# Patient Record
Sex: Male | Born: 1990 | Race: White | Hispanic: No | Marital: Single | Smoking: Current every day smoker
Health system: Southern US, Community
[De-identification: ages and names within clinical notes are randomized; demographics above are authoritative.]

## PROBLEM LIST (undated history)

## (undated) DIAGNOSIS — J189 Pneumonia, unspecified organism: Secondary | ICD-10-CM

## (undated) DIAGNOSIS — J45909 Unspecified asthma, uncomplicated: Secondary | ICD-10-CM

---

## 2013-09-12 ENCOUNTER — Encounter (HOSPITAL_COMMUNITY): Payer: Self-pay | Admitting: Emergency Medicine

## 2013-09-12 ENCOUNTER — Emergency Department (HOSPITAL_COMMUNITY)
Admission: EM | Admit: 2013-09-12 | Discharge: 2013-09-12 | Disposition: A | Discharge status: Home / Self Care | Payer: 59 | Attending: Emergency Medicine | Admitting: Emergency Medicine

## 2013-09-12 DIAGNOSIS — T23299A Burn of second degree of multiple sites of unspecified wrist and hand, initial encounter: Secondary | ICD-10-CM

## 2013-09-12 DIAGNOSIS — X038XXA Other exposure to controlled fire, not in building or structure, initial encounter: Secondary | ICD-10-CM | POA: Insufficient documentation

## 2013-09-12 DIAGNOSIS — F17200 Nicotine dependence, unspecified, uncomplicated: Secondary | ICD-10-CM | POA: Insufficient documentation

## 2013-09-12 DIAGNOSIS — Y929 Unspecified place or not applicable: Secondary | ICD-10-CM | POA: Insufficient documentation

## 2013-09-12 DIAGNOSIS — Y939 Activity, unspecified: Secondary | ICD-10-CM | POA: Insufficient documentation

## 2013-09-12 DIAGNOSIS — T23279A Burn of second degree of unspecified wrist, initial encounter: Secondary | ICD-10-CM | POA: Insufficient documentation

## 2013-09-12 DIAGNOSIS — T23209A Burn of second degree of unspecified hand, unspecified site, initial encounter: Secondary | ICD-10-CM | POA: Insufficient documentation

## 2013-09-12 MED ORDER — OXYCODONE-ACETAMINOPHEN 5-325 MG PO TABS: 1.0000 | ORAL_TABLET | ORAL | Status: DC | PRN

## 2013-09-12 MED ORDER — CEPHALEXIN 500 MG PO CAPS: 500.0000 mg | ORAL_CAPSULE | Freq: Four times a day (QID) | ORAL | Status: DC

## 2013-09-12 MED ORDER — OXYCODONE-ACETAMINOPHEN 5-325 MG PO TABS
2.0000 | ORAL_TABLET | Freq: Once | ORAL | Status: AC
  Administered 2013-09-12: 2 via ORAL
  Filled 2013-09-12: qty 2

## 2013-09-12 MED ORDER — SULFAMETHOXAZOLE-TRIMETHOPRIM 800-160 MG PO TABS: 1.0000 | ORAL_TABLET | Freq: Two times a day (BID) | ORAL | Status: DC

## 2013-09-12 MED ORDER — SILVER SULFADIAZINE 1 % EX CREA
TOPICAL_CREAM | Freq: Once | CUTANEOUS | Status: AC
Start: 2013-09-12 — End: 2013-09-12
  Administered 2013-09-12: 13:00:00 via TOPICAL
  Filled 2013-09-12: qty 85

## 2013-09-12 NOTE — ED Notes (Signed)
Applied Silvadene to underside of right arm with tongue depressor then placed 2 non adherent pads wrapped with gauze and taped; instructions on how to change given to pt as well as mother

## 2013-09-12 NOTE — Discharge Instructions (Signed)
Take Percocet as needed for pain. Apply silvadene cream twice daily. Take bactrim and keflex as directed until gone-these are your antibiotics. Refer to attached documents for more information.
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
## 2013-09-13 NOTE — ED Provider Notes (Signed)
Medical screening examination/treatment/procedure(s) were performed by non-physician practitioner and as supervising physician I was immediately available for consultation/collaboration.   EKG Interpretation None        Alfonzo Arca M Kentley Cedillo, DO 09/13/13 0739 

## 2017-01-06 DIAGNOSIS — J9600 Acute respiratory failure, unspecified whether with hypoxia or hypercapnia: Secondary | ICD-10-CM

## 2017-01-06 DIAGNOSIS — J209 Acute bronchitis, unspecified: Secondary | ICD-10-CM

## 2017-01-17 ENCOUNTER — Emergency Department (HOSPITAL_COMMUNITY): Payer: Self-pay

## 2017-01-17 ENCOUNTER — Encounter (HOSPITAL_COMMUNITY): Payer: Self-pay | Admitting: Emergency Medicine

## 2017-01-17 ENCOUNTER — Emergency Department (HOSPITAL_COMMUNITY)
Admission: EM | Admit: 2017-01-17 | Discharge: 2017-01-17 | Discharge status: Against Medical Advice | Payer: Self-pay | Attending: Emergency Medicine | Admitting: Emergency Medicine

## 2017-01-17 DIAGNOSIS — F17200 Nicotine dependence, unspecified, uncomplicated: Secondary | ICD-10-CM | POA: Insufficient documentation

## 2017-01-17 DIAGNOSIS — R0789 Other chest pain: Secondary | ICD-10-CM | POA: Insufficient documentation

## 2017-01-17 DIAGNOSIS — R0602 Shortness of breath: Secondary | ICD-10-CM | POA: Insufficient documentation

## 2017-01-17 DIAGNOSIS — R000 Tachycardia, unspecified: Secondary | ICD-10-CM | POA: Insufficient documentation

## 2017-01-17 DIAGNOSIS — R079 Chest pain, unspecified: Secondary | ICD-10-CM

## 2017-01-17 DIAGNOSIS — R05 Cough: Secondary | ICD-10-CM | POA: Insufficient documentation

## 2017-01-17 DIAGNOSIS — F1510 Other stimulant abuse, uncomplicated: Secondary | ICD-10-CM | POA: Insufficient documentation

## 2017-01-17 HISTORY — DX: Pneumonia, unspecified organism: J18.9

## 2017-01-17 LAB — I-STAT ARTERIAL BLOOD GAS, ED
Acid-base deficit: 1 mmol/L (ref 0.0–2.0)
Bicarbonate: 24.2 mmol/L (ref 20.0–28.0)
O2 SAT: 96 %
PCO2 ART: 38.8 mmHg (ref 32.0–48.0)
PH ART: 7.401 (ref 7.350–7.450)
TCO2: 25 mmol/L (ref 0–100)
pO2, Arterial: 80 mmHg — ABNORMAL LOW (ref 83.0–108.0)

## 2017-01-17 LAB — URINALYSIS, ROUTINE W REFLEX MICROSCOPIC
Bilirubin Urine: NEGATIVE
GLUCOSE, UA: NEGATIVE mg/dL
HGB URINE DIPSTICK: NEGATIVE
Ketones, ur: NEGATIVE mg/dL
Leukocytes, UA: NEGATIVE
Nitrite: NEGATIVE
Protein, ur: NEGATIVE mg/dL
Specific Gravity, Urine: 1.008 (ref 1.005–1.030)
pH: 7 (ref 5.0–8.0)

## 2017-01-17 LAB — CBC WITH DIFFERENTIAL/PLATELET
Basophils Absolute: 0 10*3/uL (ref 0.0–0.1)
Basophils Relative: 0 %
Eosinophils Absolute: 1.5 10*3/uL — ABNORMAL HIGH (ref 0.0–0.7)
Eosinophils Relative: 15 %
HCT: 51.9 % (ref 39.0–52.0)
HEMOGLOBIN: 18.1 g/dL — AB (ref 13.0–17.0)
LYMPHS ABS: 2.8 10*3/uL (ref 0.7–4.0)
LYMPHS PCT: 28 %
MCH: 33.2 pg (ref 26.0–34.0)
MCHC: 34.9 g/dL (ref 30.0–36.0)
MCV: 95.2 fL (ref 78.0–100.0)
Monocytes Absolute: 0.8 10*3/uL (ref 0.1–1.0)
Monocytes Relative: 8 %
NEUTROS PCT: 49 %
Neutro Abs: 4.8 10*3/uL (ref 1.7–7.7)
Platelets: 275 10*3/uL (ref 150–400)
RBC: 5.45 MIL/uL (ref 4.22–5.81)
RDW: 14.2 % (ref 11.5–15.5)
WBC: 9.9 10*3/uL (ref 4.0–10.5)

## 2017-01-17 LAB — RAPID URINE DRUG SCREEN, HOSP PERFORMED
Amphetamines: POSITIVE — AB
BENZODIAZEPINES: NOT DETECTED
Barbiturates: NOT DETECTED
COCAINE: NOT DETECTED
Opiates: POSITIVE — AB
Tetrahydrocannabinol: NOT DETECTED

## 2017-01-17 LAB — COMPREHENSIVE METABOLIC PANEL
ALBUMIN: 4.6 g/dL (ref 3.5–5.0)
ALT: 39 U/L (ref 17–63)
ANION GAP: 8 (ref 5–15)
AST: 36 U/L (ref 15–41)
Alkaline Phosphatase: 98 U/L (ref 38–126)
BUN: 9 mg/dL (ref 6–20)
CHLORIDE: 102 mmol/L (ref 101–111)
CO2: 29 mmol/L (ref 22–32)
CREATININE: 0.84 mg/dL (ref 0.61–1.24)
Calcium: 9.8 mg/dL (ref 8.9–10.3)
GFR calc Af Amer: >60 mL/min (ref >60)
GFR calc non Af Amer: >60 mL/min (ref >60)
Glucose, Bld: 116 mg/dL — ABNORMAL HIGH (ref 65–99)
Potassium: 5.2 mmol/L — ABNORMAL HIGH (ref 3.5–5.1)
SODIUM: 139 mmol/L (ref 135–145)
Total Bilirubin: 1 mg/dL (ref 0.3–1.2)
Total Protein: 8.3 g/dL — ABNORMAL HIGH (ref 6.5–8.1)

## 2017-01-17 LAB — I-STAT CG4 LACTIC ACID, ED
Lactic Acid, Venous: 1.57 mmol/L (ref 0.5–1.9)
Lactic Acid, Venous: 1.67 mmol/L (ref 0.5–1.9)

## 2017-01-17 LAB — PROTIME-INR
INR: 0.96
Prothrombin Time: 12.8 seconds (ref 11.4–15.2)

## 2017-01-17 LAB — I-STAT TROPONIN, ED
Troponin i, poc: 0 ng/mL (ref 0.00–0.08)
Troponin i, poc: 0.07 ng/mL (ref 0.00–0.08)

## 2017-01-17 LAB — SALICYLATE LEVEL

## 2017-01-17 LAB — D-DIMER, QUANTITATIVE: D-Dimer, Quant: 0.33 ug/mL-FEU (ref 0.00–0.50)

## 2017-01-17 MED ORDER — IPRATROPIUM-ALBUTEROL 0.5-2.5 (3) MG/3ML IN SOLN
3.0000 mL | Freq: Once | RESPIRATORY_TRACT | Status: AC
  Administered 2017-01-17: 3 mL via RESPIRATORY_TRACT
  Filled 2017-01-17: qty 3

## 2017-01-17 MED ORDER — METHYLPREDNISOLONE SODIUM SUCC 125 MG IJ SOLR
125.0000 mg | Freq: Once | INTRAMUSCULAR | Status: AC
  Administered 2017-01-17: 125 mg via INTRAVENOUS
  Filled 2017-01-17: qty 2

## 2017-01-17 NOTE — ED Triage Notes (Signed)
Pt here with increased work of breathing; pt discharged from [facility] 1 week ago after having PNA; pt sts increased SOB x 3 days

## 2017-01-17 NOTE — ED Provider Notes (Signed)
[facility]-EMERGENCY DEPT Provider Note   CSN: [id number] Arrival date & time: 01/17/17  1116     History   Chief Complaint Chief Complaint  Patient presents with  . Shortness of Breath    HPI Jesse Shannon is a 26 y.o. male.  The history is provided by the patient and a significant other.  Shortness of Breath  This is a recurrent problem. The average episode lasts 3 days. The problem occurs continuously.The current episode started more than 2 days ago. The problem has been gradually worsening. Associated symptoms include cough, sputum production, wheezing and chest pain. Pertinent negatives include no fever, no headaches, no rhinorrhea, no neck pain, no hemoptysis, no syncope, no vomiting, no abdominal pain, no rash, no leg pain and no leg swelling. Risk factors include smoking. He has tried nothing for the symptoms. The treatment provided no relief. He has had prior hospitalizations. He has had prior ED visits. He has had prior ICU admissions. Associated medical issues include pneumonia. Associated medical issues do not include asthma, COPD, CAD, heart failure or past MI.    Past Medical History:  Diagnosis Date  . Pneumonia     There are no active problems to display for this patient.   History reviewed. No pertinent surgical history.     Home Medications    Prior to Admission medications   Medication Sig Start Date End Date Taking? Authorizing Provider  cephALEXin (KEFLEX) 500 MG capsule Take 1 capsule (500 mg total) by mouth 4 (four) times daily. 09/12/13   Emilia Beck, PA-C  oxyCODONE-acetaminophen (PERCOCET/ROXICET) 5-325 MG per tablet Take 1-2 tablets by mouth every 4 (four) hours as needed for severe pain. 09/12/13   Emilia Beck, PA-C  sulfamethoxazole-trimethoprim (SEPTRA DS) 800-160 MG per tablet Take 1 tablet by mouth every 12 (twelve) hours. 09/12/13   Emilia Beck, PA-C    Family History History reviewed. No pertinent family history.  Social  History Social History  Substance Use Topics  . Smoking status: Current Every Day Smoker  . Smokeless tobacco: Never Used  . Alcohol use No     Allergies   Patient has no known allergies.   Review of Systems Review of Systems  Constitutional: Negative for appetite change, chills, diaphoresis, fatigue and fever.  HENT: Negative for congestion and rhinorrhea.   Respiratory: Positive for cough, sputum production, chest tightness, shortness of breath and wheezing. Negative for hemoptysis and stridor.   Cardiovascular: Positive for chest pain. Negative for palpitations, leg swelling and syncope.  Gastrointestinal: Negative for abdominal pain, constipation, diarrhea, nausea and vomiting.  Genitourinary: Negative for dysuria and frequency.  Musculoskeletal: Negative for back pain, neck pain and neck stiffness.  Skin: Negative for rash and wound.  Neurological: Negative for dizziness and headaches.  Psychiatric/Behavioral: Negative for agitation and confusion.  All other systems reviewed and are negative.    Physical Exam Updated Vital Signs BP (!) 136/97 (BP Location: Right Arm)   Pulse 100   Temp (!) 97.4 F (36.3 C) (Oral)   Resp 14   SpO2 100%   Physical Exam  Constitutional: He is oriented to person, place, and time. He appears well-developed and well-nourished. He appears distressed.  HENT:  Head: Normocephalic.  Nose: Nose normal.  Mouth/Throat: Oropharynx is clear and moist. No oropharyngeal exudate.  Eyes: Pupils are equal, round, and reactive to light. Conjunctivae and EOM are normal.  Neck: Normal range of motion.  Cardiovascular: Intact distal pulses.  Tachycardia present.   No murmur heard. Pulmonary/Chest: No stridor.  He is in respiratory distress. He has wheezes. He exhibits tenderness.  Abdominal: Soft. There is no tenderness. There is no guarding.  Musculoskeletal: He exhibits no tenderness.  Neurological: He is alert and oriented to person, place, and  time. No cranial nerve deficit or sensory deficit. He exhibits normal muscle tone.  Skin: Skin is warm. Capillary refill takes less than 2 seconds. He is not diaphoretic. No erythema. No pallor.  Psychiatric: He has a normal mood and affect.  Nursing note and vitals reviewed.    ED Treatments / Results  Labs (all labs ordered are listed, but only abnormal results are displayed) Labs Reviewed  COMPREHENSIVE METABOLIC PANEL - Abnormal; Notable for the following:       Result Value   Potassium 5.2 (*)    Glucose, Bld 116 (*)    Total Protein 8.3 (*)    All other components within normal limits  CBC WITH DIFFERENTIAL/PLATELET - Abnormal; Notable for the following:    Hemoglobin 18.1 (*)    Eosinophils Absolute 1.5 (*)    All other components within normal limits  RAPID URINE DRUG SCREEN, HOSP PERFORMED - Abnormal; Notable for the following:    Opiates POSITIVE (*)    Amphetamines POSITIVE (*)    All other components within normal limits  URINALYSIS, ROUTINE W REFLEX MICROSCOPIC - Abnormal; Notable for the following:    Color, Urine STRAW (*)    All other components within normal limits  I-STAT ARTERIAL BLOOD GAS, ED - Abnormal; Notable for the following:    pO2, Arterial 80.0 (*)    All other components within normal limits  CULTURE, BLOOD (ROUTINE X 2)  CULTURE, BLOOD (ROUTINE X 2)  PROTIME-INR  D-DIMER, QUANTITATIVE (NOT AT [facility])  SALICYLATE LEVEL  BLOOD GAS, ARTERIAL  I-STAT CG4 LACTIC ACID, ED  I-STAT TROPONIN, ED  I-STAT CG4 LACTIC ACID, ED  I-STAT TROPONIN, ED    EKG  EKG Interpretation  Date/Time:  Tuesday January 17 2017 13:21:24 EDT Ventricular Rate:  91 PR Interval:    QRS Duration: 86 QT Interval:  344 QTC Calculation: 424 R Axis:   89 Text Interpretation:  Sinus rhythm Biatrial enlargement Nonspecific T abnrm, anterolateral leads No prior ECG for comparison.  No STEMI Confirmed by Theda Belfast ([id number]) on 01/17/2017 4:09:42 PM       Radiology Dg Chest  2 View  Result Date: 01/17/2017 CLINICAL DATA:  Onset of shortness of breath last night. Recently diagnosed with pneumonia. Daily smoker. EXAM: CHEST  2 VIEW COMPARISON:  None in PACs FINDINGS: The lungs are hyperinflated with mild hemidiaphragm flattening. The perihilar lung markings are coarse. There is no alveolar infiltrate or pleural effusion. The heart and mediastinal structures are normal. The bony thorax exhibits no acute abnormality. IMPRESSION: Hyperinflation consistent with reactive airway disease and the patient's smoking history. Perihilar peribronchial cuffing bilaterally likely reflects acute bronchitis. No alveolar pneumonia is observed but early interstitial pneumonia cannot be excluded. Electronically Signed   By: David  [place] M.D.   On: 01/17/2017 12:38    Procedures Procedures (including critical care time)  Medications Ordered in ED Medications  ipratropium-albuterol (DUONEB) 0.5-2.5 (3) MG/3ML nebulizer solution 3 mL (3 mLs Nebulization Given 01/17/17 1333)  methylPREDNISolone sodium succinate (SOLU-MEDROL) 125 mg/2 mL injection 125 mg (125 mg Intravenous Given 01/17/17 1338)  ipratropium-albuterol (DUONEB) 0.5-2.5 (3) MG/3ML nebulizer solution 3 mL (3 mLs Nebulization Given 01/17/17 1615)     Initial Impression / Assessment and Plan / ED Course  I have reviewed  the triage vital signs and the nursing notes.  Pertinent labs & imaging results that were available during my care of the patient were reviewed by me and considered in my medical decision making (see chart for details).     Jesse Shannon is a 26 y.o. male with a past medical history significant for recent pneumonia status post intubation and ventilator support who presents with continued productive cough, shortness of breath, and third chest pain. Patient says that last week, he was admitted at [facility] to the ICU after intubation for "resting my lungs". He says that he thought he was told he had a pneumonia  but was generally not given information on this, discharge information, or antibiotics at discharge. Patient says that since discharge for the last few days he has had worsening shortness of breath and work of breathing. He reports that he is feeling is breathing very fast and has had continued cough. He reports he is having chest pain going around both sides of his chest. He does not have significant central chest pain. He describes the pain as moderate to severe. He reports it is worse with coughing and pleuritic. He denies any exertional chest pain but reports continued shortness of breath. He denies nausea, vomiting, constipation, diarrhea, or dysuria. He has no history in the chart of asthma or other prior medical problems. He denies recent dramatic injuries.  On exam, patient is to Make it has increased work of breathing. Patient has retractions and has sternocleidomastoid contractions with breathing. Patient is maintaining his oxygen saturation on room air. Patient is afebrile but has slight tachycardia. Patient's abdomen was nontender. Chest is nontender. Patient had significant wheezing in all lung fields. Patient had some tenderness on his lateral chest and back. Significant lower extremity edema. No focal neurologic deficits. Normal pulses in upper extremity.  Given patient's reported recent pneumonia and continued cough with shortness breath, patient will have workup to look for recurrent pneumonia. With his pleuritic pain, tachycardia, and shortness of breath, he will also have d-dimer sent to look for pulmonary embolism. She will be given steroids and breathing treatments to try and help his work of breathing and his wheezing.   Anticipate reassessment following medications and diagnostic testing.  Patients troponin was negative times two but was rising. Patient will have third troponin.  Patient had improvement in wheezing after breathing treatment. Patient continued to have some wheezing  and will have a repeat breathing treatment. Patient reports that his breathing is feeling better after steroids and initial treatment.  Patient laboratory testing began to return. Patient was found to have opioids and amphetamines in his urine. Patient says that the amphetamines are due to crystal meth that he smoked several days ago. Patient says that he thought that might be contributing to his symptoms. Suspect patient may have had asthma exacerbated by the meth causing his symptoms.  Patient will be reassessed after the reading treatment. Anticipate discharge with albuterol and PCP follow-up. Heart rate improved after drinking some fluids. Suspect increased heart rate may be due to albuterol treatments as D dimer was negative. Doubt PE. X-ray shows no pneumonia.  Care transferred to Dr. Juleen China while awaiting third troponin and reassessment. Anticipate discharge if vitals improved and symptoms improved. Care transferred in stable condition.   Final Clinical Impressions(s) / ED Diagnoses   Final diagnoses:  SOB (shortness of breath)  Methamphetamine abuse  Chest pain, unspecified type  Tachycardia    Clinical Impression: 1. SOB (shortness of breath)  2. Methamphetamine abuse   3. Chest pain, unspecified type   4. Tachycardia     Disposition: CAre transferred to Dr. Juleen China while awaiting troponin and reassessment of vitals.     Tegeler, Canary Brim, MD 01/17/17 1919

## 2017-01-17 NOTE — ED Notes (Signed)
Pt wheeled back to room from waiting area. Pt assisted into gown and onto monitor.

## 2017-01-17 NOTE — ED Notes (Addendum)
Patient anxious. Aware that he would be discharged shortly. Patient removed all monitoring equipment, as well as his IV. Patient left without discharge instructions. Notified EDP of elopement prior to discharge.

## 2017-01-17 NOTE — ED Notes (Signed)
Patient transported to X-ray 

## 2017-01-22 LAB — CULTURE, BLOOD (ROUTINE X 2)
Culture: NO GROWTH
Culture: NO GROWTH
SPECIAL REQUESTS: ADEQUATE
Special Requests: ADEQUATE

## 2017-03-14 ENCOUNTER — Inpatient Hospital Stay (HOSPITAL_COMMUNITY)
Admission: EM | Admit: 2017-03-14 | Discharge: 2017-03-14 | DRG: 202 | Discharge status: Against Medical Advice | Payer: Self-pay | Attending: Family Medicine | Admitting: Family Medicine

## 2017-03-14 ENCOUNTER — Encounter (HOSPITAL_COMMUNITY): Payer: Self-pay | Admitting: *Deleted

## 2017-03-14 ENCOUNTER — Emergency Department (HOSPITAL_COMMUNITY): Payer: Self-pay

## 2017-03-14 DIAGNOSIS — J45909 Unspecified asthma, uncomplicated: Secondary | ICD-10-CM | POA: Diagnosis present

## 2017-03-14 DIAGNOSIS — J9602 Acute respiratory failure with hypercapnia: Secondary | ICD-10-CM | POA: Diagnosis present

## 2017-03-14 DIAGNOSIS — J4522 Mild intermittent asthma with status asthmaticus: Principal | ICD-10-CM | POA: Diagnosis present

## 2017-03-14 DIAGNOSIS — J45901 Unspecified asthma with (acute) exacerbation: Secondary | ICD-10-CM

## 2017-03-14 DIAGNOSIS — Z87898 Personal history of other specified conditions: Secondary | ICD-10-CM

## 2017-03-14 DIAGNOSIS — J4521 Mild intermittent asthma with (acute) exacerbation: Secondary | ICD-10-CM

## 2017-03-14 DIAGNOSIS — F1290 Cannabis use, unspecified, uncomplicated: Secondary | ICD-10-CM | POA: Diagnosis present

## 2017-03-14 DIAGNOSIS — F1591 Other stimulant use, unspecified, in remission: Secondary | ICD-10-CM

## 2017-03-14 DIAGNOSIS — J9692 Respiratory failure, unspecified with hypercapnia: Secondary | ICD-10-CM

## 2017-03-14 DIAGNOSIS — R000 Tachycardia, unspecified: Secondary | ICD-10-CM

## 2017-03-14 DIAGNOSIS — Z87891 Personal history of nicotine dependence: Secondary | ICD-10-CM

## 2017-03-14 DIAGNOSIS — D72829 Elevated white blood cell count, unspecified: Secondary | ICD-10-CM

## 2017-03-14 DIAGNOSIS — J4552 Severe persistent asthma with status asthmaticus: Secondary | ICD-10-CM

## 2017-03-14 HISTORY — DX: Unspecified asthma, uncomplicated: J45.909

## 2017-03-14 LAB — BLOOD GAS, ARTERIAL
Acid-Base Excess: 2.9 mmol/L — ABNORMAL HIGH (ref 0.0–2.0)
Bicarbonate: 29.5 mmol/L — ABNORMAL HIGH (ref 20.0–28.0)
Drawn by: 232811
O2 SAT: 91.8 %
PATIENT TEMPERATURE: 98.1
PCO2 ART: 55.2 mmHg — AB (ref 32.0–48.0)
pH, Arterial: 7.346 — ABNORMAL LOW (ref 7.350–7.450)
pO2, Arterial: 65.8 mmHg — ABNORMAL LOW (ref 83.0–108.0)

## 2017-03-14 LAB — BASIC METABOLIC PANEL
Anion gap: 11 (ref 5–15)
BUN: 12 mg/dL (ref 6–20)
CHLORIDE: 97 mmol/L — AB (ref 101–111)
CO2: 29 mmol/L (ref 22–32)
Calcium: 8.8 mg/dL — ABNORMAL LOW (ref 8.9–10.3)
Creatinine, Ser: 0.74 mg/dL (ref 0.61–1.24)
GFR calc Af Amer: >60 mL/min (ref >60)
GFR calc non Af Amer: >60 mL/min (ref >60)
Glucose, Bld: 139 mg/dL — ABNORMAL HIGH (ref 65–99)
Potassium: 3.8 mmol/L (ref 3.5–5.1)
SODIUM: 137 mmol/L (ref 135–145)

## 2017-03-14 LAB — HEMOGLOBIN A1C
Hgb A1c MFr Bld: 5.5 % (ref 4.8–5.6)
MEAN PLASMA GLUCOSE: 111.15 mg/dL

## 2017-03-14 LAB — RESPIRATORY PANEL BY PCR
Adenovirus: NOT DETECTED
Bordetella pertussis: NOT DETECTED
CORONAVIRUS 229E-RVPPCR: NOT DETECTED
CORONAVIRUS NL63-RVPPCR: NOT DETECTED
CORONAVIRUS OC43-RVPPCR: NOT DETECTED
Chlamydophila pneumoniae: NOT DETECTED
Coronavirus HKU1: NOT DETECTED
INFLUENZA B-RVPPCR: NOT DETECTED
Influenza A: NOT DETECTED
MYCOPLASMA PNEUMONIAE-RVPPCR: NOT DETECTED
Metapneumovirus: NOT DETECTED
PARAINFLUENZA VIRUS 1-RVPPCR: NOT DETECTED
PARAINFLUENZA VIRUS 2-RVPPCR: NOT DETECTED
PARAINFLUENZA VIRUS 3-RVPPCR: NOT DETECTED
PARAINFLUENZA VIRUS 4-RVPPCR: NOT DETECTED
RESPIRATORY SYNCYTIAL VIRUS-RVPPCR: NOT DETECTED
Rhinovirus / Enterovirus: NOT DETECTED

## 2017-03-14 LAB — CBC WITH DIFFERENTIAL/PLATELET
Basophils Absolute: 0 10*3/uL (ref 0.0–0.1)
Basophils Relative: 0 %
EOS ABS: 2.2 10*3/uL — AB (ref 0.0–0.7)
Eosinophils Relative: 13 %
HCT: 42.5 % (ref 39.0–52.0)
Hemoglobin: 14.5 g/dL (ref 13.0–17.0)
Lymphocytes Relative: 13 %
Lymphs Abs: 2.3 10*3/uL (ref 0.7–4.0)
MCH: 32.4 pg (ref 26.0–34.0)
MCHC: 34.1 g/dL (ref 30.0–36.0)
MCV: 95.1 fL (ref 78.0–100.0)
Monocytes Absolute: 0.8 10*3/uL (ref 0.1–1.0)
Monocytes Relative: 5 %
NEUTROS PCT: 69 %
Neutro Abs: 11.9 10*3/uL — ABNORMAL HIGH (ref 1.7–7.7)
Platelets: 373 10*3/uL (ref 150–400)
RBC: 4.47 MIL/uL (ref 4.22–5.81)
RDW: 13.9 % (ref 11.5–15.5)
WBC: 17.1 10*3/uL — ABNORMAL HIGH (ref 4.0–10.5)

## 2017-03-14 LAB — EXPECTORATED SPUTUM ASSESSMENT W REFEX TO RESP CULTURE

## 2017-03-14 LAB — EXPECTORATED SPUTUM ASSESSMENT W GRAM STAIN, RFLX TO RESP C

## 2017-03-14 LAB — INFLUENZA PANEL BY PCR (TYPE A & B)
Influenza A By PCR: NEGATIVE
Influenza B By PCR: NEGATIVE

## 2017-03-14 LAB — MRSA PCR SCREENING: MRSA by PCR: POSITIVE — AB

## 2017-03-14 MED ORDER — IPRATROPIUM BROMIDE 0.02 % IN SOLN
1.0000 mg | Freq: Once | RESPIRATORY_TRACT | Status: AC
  Administered 2017-03-14: 1 mg via RESPIRATORY_TRACT
  Filled 2017-03-14: qty 5

## 2017-03-14 MED ORDER — IPRATROPIUM-ALBUTEROL 0.5-2.5 (3) MG/3ML IN SOLN
3.0000 mL | Freq: Four times a day (QID) | RESPIRATORY_TRACT | Status: DC
  Filled 2017-03-14 (×2): qty 3

## 2017-03-14 MED ORDER — ORAL CARE MOUTH RINSE: 15.0000 mL | Freq: Two times a day (BID) | OROMUCOSAL | Status: DC

## 2017-03-14 MED ORDER — SODIUM CHLORIDE 0.9 % IV BOLUS (SEPSIS)
1000.0000 mL | Freq: Once | INTRAVENOUS | Status: AC
  Administered 2017-03-14: 1000 mL via INTRAVENOUS

## 2017-03-14 MED ORDER — BUDESONIDE 0.5 MG/2ML IN SUSP
0.5000 mg | Freq: Two times a day (BID) | RESPIRATORY_TRACT | Status: DC
  Administered 2017-03-14: 0.5 mg via RESPIRATORY_TRACT
  Filled 2017-03-14 (×2): qty 2

## 2017-03-14 MED ORDER — METHYLPREDNISOLONE SODIUM SUCC 125 MG IJ SOLR: 125.0000 mg | Freq: Four times a day (QID) | INTRAMUSCULAR | Status: DC

## 2017-03-14 MED ORDER — METHYLPREDNISOLONE SODIUM SUCC 125 MG IJ SOLR
60.0000 mg | Freq: Four times a day (QID) | INTRAMUSCULAR | Status: DC
Start: 2017-03-14 — End: 2017-03-15
  Administered 2017-03-14 (×2): 60 mg via INTRAVENOUS
  Filled 2017-03-14 (×2): qty 2

## 2017-03-14 MED ORDER — ALBUTEROL SULFATE (2.5 MG/3ML) 0.083% IN NEBU: 2.5000 mg | INHALATION_SOLUTION | RESPIRATORY_TRACT | Status: DC

## 2017-03-14 MED ORDER — ALBUTEROL (5 MG/ML) CONTINUOUS INHALATION SOLN
15.0000 mg/h | INHALATION_SOLUTION | Freq: Once | RESPIRATORY_TRACT | Status: AC
  Administered 2017-03-14: 15 mg/h via RESPIRATORY_TRACT
  Filled 2017-03-14: qty 20

## 2017-03-14 MED ORDER — ALBUTEROL SULFATE (2.5 MG/3ML) 0.083% IN NEBU: 2.5000 mg | INHALATION_SOLUTION | RESPIRATORY_TRACT | Status: DC | PRN

## 2017-03-14 MED ORDER — ALBUTEROL SULFATE (2.5 MG/3ML) 0.083% IN NEBU: 5.0000 mg | INHALATION_SOLUTION | Freq: Once | RESPIRATORY_TRACT | Status: DC

## 2017-03-14 MED ORDER — ALBUTEROL (5 MG/ML) CONTINUOUS INHALATION SOLN
10.0000 mg/h | INHALATION_SOLUTION | Freq: Once | RESPIRATORY_TRACT | Status: AC
  Administered 2017-03-14: 10 mg/h via RESPIRATORY_TRACT

## 2017-03-14 MED ORDER — CHLORHEXIDINE GLUCONATE CLOTH 2 % EX PADS: 6.0000 | MEDICATED_PAD | Freq: Every day | CUTANEOUS | Status: DC

## 2017-03-14 MED ORDER — IPRATROPIUM BROMIDE 0.02 % IN SOLN: 0.5000 mg | Freq: Once | RESPIRATORY_TRACT | Status: DC

## 2017-03-14 MED ORDER — MONTELUKAST SODIUM 10 MG PO TABS
10.0000 mg | ORAL_TABLET | Freq: Every day | ORAL | Status: DC
  Administered 2017-03-14: 10 mg via ORAL
  Filled 2017-03-14: qty 1

## 2017-03-14 MED ORDER — MUPIROCIN 2 % EX OINT: 1.0000 "application " | TOPICAL_OINTMENT | Freq: Two times a day (BID) | CUTANEOUS | Status: DC

## 2017-03-14 MED ORDER — MAGNESIUM SULFATE 2 GM/50ML IV SOLN
2.0000 g | Freq: Once | INTRAVENOUS | Status: AC
  Administered 2017-03-14: 2 g via INTRAVENOUS
  Filled 2017-03-14: qty 50

## 2017-03-14 MED ORDER — ALBUTEROL SULFATE (2.5 MG/3ML) 0.083% IN NEBU: 2.5000 mg | INHALATION_SOLUTION | Freq: Four times a day (QID) | RESPIRATORY_TRACT | Status: DC

## 2017-03-14 MED ORDER — SODIUM CHLORIDE 0.9 % IV SOLN
INTRAVENOUS | Status: DC
  Administered 2017-03-14: 12:00:00 via INTRAVENOUS

## 2017-03-14 MED ORDER — ARFORMOTEROL TARTRATE 15 MCG/2ML IN NEBU
15.0000 ug | INHALATION_SOLUTION | Freq: Two times a day (BID) | RESPIRATORY_TRACT | Status: DC
  Administered 2017-03-14: 15 ug via RESPIRATORY_TRACT
  Filled 2017-03-14 (×2): qty 2

## 2017-03-14 MED ORDER — ENOXAPARIN SODIUM 40 MG/0.4ML ~~LOC~~ SOLN
40.0000 mg | SUBCUTANEOUS | Status: DC
Start: 2017-03-14 — End: 2017-03-14

## 2017-03-14 MED ORDER — IPRATROPIUM BROMIDE 0.02 % IN SOLN: 0.5000 mg | Freq: Four times a day (QID) | RESPIRATORY_TRACT | Status: DC

## 2017-03-14 MED ORDER — IPRATROPIUM BROMIDE 0.02 % IN SOLN
0.5000 mg | Freq: Once | RESPIRATORY_TRACT | Status: AC
  Administered 2017-03-14: 0.5 mg via RESPIRATORY_TRACT
  Filled 2017-03-14: qty 2.5

## 2017-03-14 NOTE — Progress Notes (Signed)
Respiratory Panel is negative. Droplet precautions discontinued.

## 2017-03-14 NOTE — ED Notes (Signed)
Bed: WA17 Expected date:  Expected time:  Means of arrival:  Comments: 26 yo M/ Asthma attack

## 2017-03-14 NOTE — H&P (Addendum)
History and Physical    Jesse Shannon NGE:[id number] DOB: 1991/03/03 DOA: 03/14/2017  PCP: Patient, No Pcp Per  Patient coming from: home  I have personally briefly reviewed patient's old medical records in [facility]  Chief Complaint: Short of breath  HPI: Jesse Shannon is Collyn Selk 26 y.o. male with medical history significant of asthma, no PFTs and history of substance abuse (denies current use, admits to past MJ use, chart hx of past meth use) is presenting with shortness of breath.  Patient's diagnosis of asthma is recent. He was diagnosed about 2 months ago during an admission to [facility] where he required intubation.  Patient was Ermalinda Joubert smoker prior to this episode, but has since quit.  He notes that he started with wheezing and shortness of breath that started about 2 days ago. There were no identifiable triggers. No smoking, no fevers, no allergens he knows of. Albuterol was not helping. He does not take Tameeka Luo daily controller medicine.  He notes that he has been to the hospital 4 times in the last 2 months for his problems breathing.  He does not have Schylar Allard primary care physician or insurance.  ED Course: The patient received 3 albuterol treatments with EMS, solumedrol, and epinephrine.  He received IV magnesium. Continuous albuterol.  Review of Systems: As per HPI otherwise 10 point review of systems negative.   Past Medical History:  Diagnosis Date  . Asthma   . Pneumonia     No past surgical history on file.   reports that he has been smoking.  He has never used smokeless tobacco. He reports that he uses drugs, including Marijuana. He reports that he does not drink alcohol.  No Known Allergies  No family history on file.   Prior to Admission medications   Medication Sig Start Date End Date Taking? Authorizing Provider  cephALEXin (KEFLEX) 500 MG capsule Take 1 capsule (500 mg total) by mouth 4 (four) times daily. Patient not taking: Reported on 01/17/2017 09/12/13   Emilia Beck, PA-C  guaiFENesin (MUCINEX) 600 MG 12 hr tablet Take 600 mg by mouth daily.    [provider]  oxyCODONE-acetaminophen (PERCOCET/ROXICET) 5-325 MG per tablet Take 1-2 tablets by mouth every 4 (four) hours as needed for severe pain. Patient not taking: Reported on 01/17/2017 09/12/13   Emilia Beck, PA-C  sulfamethoxazole-trimethoprim (SEPTRA DS) 800-160 MG per tablet Take 1 tablet by mouth every 12 (twelve) hours. Patient not taking: Reported on 01/17/2017 09/12/13   Emilia Beck, PA-C    Physical Exam: Vitals:   03/14/17 0612 03/14/17 0706 03/14/17 0731 03/14/17 0820  BP:   (!) 113/99   Pulse:  (!) 144 (!) 136   Resp:  (!) 27 14   Temp:      TempSrc:      SpO2: 93% 99% 98% 99%  Weight:      Height:        Constitutional: appears comfortable on bipap, thin Vitals:   03/14/17 0612 03/14/17 0706 03/14/17 0731 03/14/17 0820  BP:   (!) 113/99   Pulse:  (!) 144 (!) 136   Resp:  (!) 27 14   Temp:      TempSrc:      SpO2: 93% 99% 98% 99%  Weight:      Height:       Eyes: PERRL, lids and conjunctivae normal ENMT: Mucous membranes are moist. Posterior pharynx clear of any exudate or lesions.Normal dentition.  Neck: normal, supple, no masses, no thyromegaly  Respiratory: Diffuse wheezing on BiPAP. Cardiovascular: Tachycardic rate and rhythm, no murmurs / rubs / gallops. No extremity edema. 2+ pedal pulses.  Abdomen: no tenderness, no masses palpated. No hepatosplenomegaly. Bowel sounds positive.  Musculoskeletal: no clubbing / cyanosis. No joint deformity upper and lower extremities. Good ROM, no contractures. Normal muscle tone.  Skin: no rashes, lesions, ulcers. No induration Neurologic: CN 2-12 grossly intact. Sensation intact. Strength 5/5 in all 4.  Psychiatric: Normal judgment and insight. Alert and oriented x 3. Normal mood.   Labs on Admission: I have personally reviewed following labs and imaging studies  CBC:  Recent Labs Lab 03/14/17 0612    WBC 17.1*  NEUTROABS 11.9*  HGB 14.5  HCT 42.5  MCV 95.1  PLT 373   Basic Metabolic Panel:  Recent Labs Lab 03/14/17 0612  NA 137  K 3.8  CL 97*  CO2 29  GLUCOSE 139*  BUN 12  CREATININE 0.74  CALCIUM 8.8*   GFR: Estimated Creatinine Clearance: 123.3 mL/min (by C-G formula based on SCr of 0.74 mg/dL). Liver Function Tests: No results for input(s): AST, ALT, ALKPHOS, BILITOT, PROT, ALBUMIN in the last 168 hours. No results for input(s): LIPASE, AMYLASE in the last 168 hours. No results for input(s): AMMONIA in the last 168 hours. Coagulation Profile: No results for input(s): INR, PROTIME in the last 168 hours. Cardiac Enzymes: No results for input(s): CKTOTAL, CKMB, CKMBINDEX, TROPONINI in the last 168 hours. BNP (last 3 results) No results for input(s): PROBNP in the last 8760 hours. HbA1C: No results for input(s): HGBA1C in the last 72 hours. CBG: No results for input(s): GLUCAP in the last 168 hours. Lipid Profile: No results for input(s): CHOL, HDL, LDLCALC, TRIG, CHOLHDL, LDLDIRECT in the last 72 hours. Thyroid Function Tests: No results for input(s): TSH, T4TOTAL, FREET4, T3FREE, THYROIDAB in the last 72 hours. Anemia Panel: No results for input(s): VITAMINB12, FOLATE, FERRITIN, TIBC, IRON, RETICCTPCT in the last 72 hours. Urine analysis:    Component Value Date/Time   COLORURINE STRAW (Graylyn Bunney) 01/17/2017 1320   APPEARANCEUR CLEAR 01/17/2017 1320   LABSPEC 1.008 01/17/2017 1320   PHURINE 7.0 01/17/2017 1320   GLUCOSEU NEGATIVE 01/17/2017 1320   HGBUR NEGATIVE 01/17/2017 1320   BILIRUBINUR NEGATIVE 01/17/2017 1320   KETONESUR NEGATIVE 01/17/2017 1320   PROTEINUR NEGATIVE 01/17/2017 1320   NITRITE NEGATIVE 01/17/2017 1320   LEUKOCYTESUR NEGATIVE 01/17/2017 1320    Radiological Exams on Admission: Dg Chest Portable 1 View  Result Date: 03/14/2017 CLINICAL DATA:  Sudden onset wheezing and shortness of breath EXAM: PORTABLE CHEST 1 VIEW COMPARISON:   01/17/2017 FINDINGS: Pulmonary hyperinflation. Perihilar opacities with peribronchial thickening suggesting reactive airways disease versus bronchiolitis. No airspace disease or consolidation in the lungs. No blunting of costophrenic angles. No pneumothorax. Mediastinal contours appear intact. Normal heart size and pulmonary vascularity. IMPRESSION: Hyperinflation and perihilar opacities suggesting reactive airways disease versus bronchiolitis. No focal consolidation. Electronically Signed   By: Burman Nieves M.D.   On: 03/14/2017 06:23    EKG: Independently reviewed. Compared to priors.  Poor tracing.  Appears similar with sinus tachycardia.  T wave flattening in inferior leads is different.    Assessment/Plan Active Problems:   Asthma  Acute Hypercarbic Respiratory Failure  Asthma Exacerbation:  No PFT's in chart, but history of recent asthma diagnosis about 2 months ago requiring intubation. He notes that when he was around 25 years old had what sounds like reactive airway disease or asthma. History of smoking, but he has quit with  recent exacerbations. 1 ED visit notes association with methamphetamine use. No controller medicine no primary care.  He uses albuterol only prn related to these episodes within the past few months.   Status post soluMedrol, epi, magnesium, continuous nebs x2 in ED. Placed on bipap for hypercarbic resp failure abg pH 7.346/55.2/65.8 CXR with reactive airway disease vs bronchiolitis  Admit to stepdown Solumedrol 125 q6 Scheduled nebs q2 with q1 prn Continue bipap RVP and flu pending, sputum cx collected in ED Pulm c/s requested  Needs PFTs eventually Needs to go home on controller medicine and with PCP follow up  Tachycardia: related to above with nebs.    Leukocytosis: reactive 2/2 viral infection or steroids en route  Hx substance abuse: denies current use.  Utox pending.   Elevated BG: likely 2/2 steroids, f/u a1c, SSI to be ordered prn  DVT  prophylaxis: lovenox  Code Status: full  Family Communication: none Disposition Plan: home  Consults called: PCCM consulted by ED  Admission status: stepdown    Lacretia Nicks MD [facility] [phone]  If 7PM-7AM, please contact night-coverage [email or web address] Password [email or web address]  03/14/2017, 8:23 AM

## 2017-03-14 NOTE — ED Provider Notes (Signed)
TIME SEEN: 5:55 AM  CHIEF COMPLAINT: asthma exacerbation  HPI: Pt is a 26 year old male with recent diagnosis of asthma who was previously intubated at [facility] approximately 2 months ago for an asthma exacerbation who presents to the emergency department with 3 days of shortness of breath or wheezing. States he is using albuterol at home without any relief. States he previously was a smoker but quit 2 months ago when he had his first episode. He has never been diagnosed with asthma before. No fevers. He is now having productive cough with yellow sputum. No leg swelling or pain. No history of PE or DVT. No chest discomfort. Given 3 albuterol treatments with EMS, 125 mg of IV Solu-Medrol and 0.3 mg of IM epinephrine prior to arrival.  ROS: See HPI Constitutional: no fever  Eyes: no drainage  ENT: no runny nose   Cardiovascular:  no chest pain  Resp:  SOB  GI: no vomiting GU: no dysuria Integumentary: no rash  Allergy: no hives  Musculoskeletal: no leg swelling  Neurological: no slurred speech ROS otherwise negative  PAST MEDICAL HISTORY/PAST SURGICAL HISTORY:  Past Medical History:  Diagnosis Date  . Asthma   . Pneumonia     MEDICATIONS:  Prior to Admission medications   Medication Sig Start Date End Date Taking? Authorizing Provider  cephALEXin (KEFLEX) 500 MG capsule Take 1 capsule (500 mg total) by mouth 4 (four) times daily. Patient not taking: Reported on 01/17/2017 09/12/13   Emilia Beck, PA-C  guaiFENesin (MUCINEX) 600 MG 12 hr tablet Take 600 mg by mouth daily.    [provider]  oxyCODONE-acetaminophen (PERCOCET/ROXICET) 5-325 MG per tablet Take 1-2 tablets by mouth every 4 (four) hours as needed for severe pain. Patient not taking: Reported on 01/17/2017 09/12/13   Emilia Beck, PA-C  sulfamethoxazole-trimethoprim (SEPTRA DS) 800-160 MG per tablet Take 1 tablet by mouth every 12 (twelve) hours. Patient not taking: Reported on 01/17/2017 09/12/13    Emilia Beck, PA-C    ALLERGIES:  No Known Allergies  SOCIAL HISTORY:  Social History  Substance Use Topics  . Smoking status: Current Every Day Smoker  . Smokeless tobacco: Never Used  . Alcohol use No    FAMILY HISTORY: No family history on file.  EXAM: BP (!) 147/92 (BP Location: Right Arm)   Pulse (!) 135   Temp 97.8 F (36.6 C) (Oral)   Resp (!) 22   Ht 6' (1.829 m)   Wt 61.2 kg (135 lb)   SpO2 94%   BMI 18.31 kg/m  CONSTITUTIONAL: Alert and oriented and responds appropriately to questions. Well-appearing; well-nourished HEAD: Normocephalic EYES: Conjunctivae clear, pupils appear equal, EOMI ENT: normal nose; moist mucous membranes NECK: Supple, no meningismus, no nuchal rigidity, no LAD  CARD: regular and tachycardic; S1 and S2 appreciated; no murmurs, no clicks, no rubs, no gallops RESP: Normal chest excursion without splinting, patient is tachypneic, speaking short sentences, diminished aeration at bases bilaterally, diffuse expiratory wheezing noted, no rhonchi or rales, no hypoxia on room air, in mild to moderate respiratory distress ABD/GI: Normal bowel sounds; non-distended; soft, non-tender, no rebound, no guarding, no peritoneal signs, no hepatosplenomegaly BACK:  The back appears normal and is non-tender to palpation, there is no CVA tenderness EXT: Normal ROM in all joints; non-tender to palpation; no edema; normal capillary refill; no cyanosis, no calf tenderness or swelling    SKIN: Normal color for age and race; warm; no rash NEURO: Moves all extremities equally PSYCH: The patient's mood and  manner are appropriate. Grooming and personal hygiene are appropriate.  MEDICAL DECISION MAKING: Pt here with asthma exacerbation. Given 3 albuterol treatments with EMS, Solu-Medrol and epinephrine. Give IV magnesium, IV fluids, continuous albuterol with Atrovent. Will obtain labs, chest x-ray, ABG. Patient may need admission.  ED PROGRESS: Pt's ABG shows a pH  is 7.342, PCO2 56, PO2 67, bicarbonate 29.5.  CXR shows no pneumonia or edema.  6:55 AM  Pt reports feeling "much better". Labs show leukocytosis with left shift which may be reactive. No signs of infection at this time. He now has diffuse wheezing that is audible even without stethoscope auscultation. His aeration has improved but he still tachypneic with retractions. Given mild CO2 retention on ABG and work of breathing, will place patient on BiPAP. He has received IV magnesium. I feel he will need admission. Does not currently have a primary care doctor. He is comfortable with this plan.   7:30 AM  Pt appears markedly improved on BiPAP. Tachypnea, retractions have improved. He is speaking full sentences. States "I feel great".   7:53 AM  D/w Dr. Craige Cotta with CCM.  They will see the patient in consult. Hospitalist feels comfortable placing the admission orders to stepdown given patient is improving clinically on BiPAP. We are attempting to obtain hospital records from [facility]. Appreciate critical care and hospitalist help with this patient. He is giving a second continuous treatment now.  Hospitalist to place admission orders.   I reviewed all nursing notes, vitals, pertinent previous records, EKGs, lab and urine results, imaging (as available).     EKG Interpretation  Date/Time:  Tuesday March 14 2017 06:48:20 EDT Ventricular Rate:  136 PR Interval:    QRS Duration: 88 QT Interval:  282 QTC Calculation: 425 R Axis:   90 Text Interpretation:  Sinus tachycardia Consider right atrial enlargement Borderline right axis deviation Borderline T abnormalities, inferior leads Artifact in lead(s) II III aVF V4 V5 Confirmed by Harlen Danford, Baxter Hire [id number]) on 03/14/2017 7:00:08 AM         CRITICAL CARE Performed by: Raelyn Number   Total critical care time: 55 minutes  Critical care time was exclusive of separately billable procedures and treating other patients.  Critical care was  necessary to treat or prevent imminent or life-threatening deterioration.  Critical care was time spent personally by me on the following activities: development of treatment plan with patient and/or surrogate as well as nursing, discussions with consultants, evaluation of patient's response to treatment, examination of patient, obtaining history from patient or surrogate, ordering and performing treatments and interventions, ordering and review of laboratory studies, ordering and review of radiographic studies, pulse oximetry and re-evaluation of patient's condition.    Krzysztof Reichelt, Layla Maw, DO 03/14/17 0800

## 2017-03-14 NOTE — Consult Note (Signed)
Name: Jesse Shannon MRN: [id number] DOB: 1990/07/30    ADMISSION DATE:  03/14/2017 CONSULTATION DATE:  03/14/17  REFERRING MD :  Dr. Lowell Guitar / [facility]   CHIEF COMPLAINT:  Shortness of Breath    HISTORY OF PRESENT ILLNESS:  26 y/o M, smoker, who presented to [facility] on 10/16 with reports of shortness of breath.    The patient was recently admitted in 12/2016 at [facility] for an asthma exacerbation and required intubation.  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  He denies known allergies (food/medications/seasonal).  He states he used to smoke cigarettes / marijuana but has not since his last intubation.  He denies family history of CF / autoimmune diseases.    He reports he began having shortness of breath acutely approximately 2-3 days before presentation.  This am he was unable to breathe and was having back pain prompting EMS evaluation.  He had to do a controlled slide down the stairs due to shortness of breath.  He denies known sick contacts or flu-like symptoms.  He was treated with epinephrine, solumedrol and nebulized bronchodilators per EMS.  In the ER he was placed on BiPAP and given a continuous nebulization x2.  Per RN, he was unable to speak on arrival.  CXR was negative for acute infiltrate but showed hyperinflation and peribronchial thickening.  Admitted per [facility] for further evaluation.  Patient reports feeling better after using BiPAP therapy.    PCCM consulted for evaluation of wheezing.    PAST MEDICAL HISTORY :   has a past medical history of Asthma and Pneumonia.   has no past surgical history on file.  Prior to Admission medications   Medication Sig Start Date End Date Taking? Authorizing Provider  cephALEXin (KEFLEX) 500 MG capsule Take 1 capsule (500 mg total) by mouth 4 (four) times daily. Patient not taking: Reported on 01/17/2017 09/12/13   Emilia Beck, PA-C  guaiFENesin (MUCINEX) 600 MG 12 hr  tablet Take 600 mg by mouth daily.    [provider]  oxyCODONE-acetaminophen (PERCOCET/ROXICET) 5-325 MG per tablet Take 1-2 tablets by mouth every 4 (four) hours as needed for severe pain. Patient not taking: Reported on 01/17/2017 09/12/13   Emilia Beck, PA-C  sulfamethoxazole-trimethoprim (SEPTRA DS) 800-160 MG per tablet Take 1 tablet by mouth every 12 (twelve) hours. Patient not taking: Reported on 01/17/2017 09/12/13   Emilia Beck, PA-C    No Known Allergies  FAMILY HISTORY:  family history includes Asthma in his other; Lung disease in his other.  SOCIAL HISTORY:  reports that he has been smoking.  He has never used smokeless tobacco. He reports that he uses drugs, including Marijuana. He reports that he does not drink alcohol.  REVIEW OF SYSTEMS:  POSITIVES IN BOLD Constitutional: Negative for fever, chills, weight loss, malaise/fatigue and diaphoresis.  HENT: Negative for hearing loss, ear pain, nosebleeds, congestion, sore throat, neck pain, tinnitus and ear discharge.   Eyes: Negative for blurred vision, double vision, photophobia, pain, discharge and redness.  Respiratory: Negative for cough, hemoptysis, sputum production, shortness of breath, wheezing and stridor.   Cardiovascular: Negative for chest pain, palpitations, orthopnea, claudication, leg swelling and PND.  Gastrointestinal: Negative for heartburn, nausea, vomiting, abdominal pain, diarrhea, constipation, blood in stool and melena.  Genitourinary: Negative for dysuria, urgency, frequency, hematuria and flank pain.  Musculoskeletal: Negative for myalgias, back pain, joint pain and falls.  Skin: Negative for itching and  rash.  Neurological: Negative for dizziness, tingling, tremors, sensory change, speech change, focal weakness, seizures, loss of consciousness, weakness and headaches.  Endo/Heme/Allergies: Negative for environmental allergies and polydipsia. Does not bruise/bleed  easily.  SUBJECTIVE:   VITAL SIGNS: Temp:  [97.8 F (36.6 C)] 97.8 F (36.6 C) (10/16 0536) Pulse Rate:  [135-144] 136 (10/16 0731) Resp:  [14-27] 14 (10/16 0731) BP: (113-147)/(92-99) 113/99 (10/16 0731) SpO2:  [93 %-99 %] 99 % (10/16 0820) FiO2 (%):  [40 %] 40 % (10/16 0820) Weight:  [135 lb (61.2 kg)] 135 lb (61.2 kg) (10/16 0540)  PHYSICAL EXAMINATION: General:  Thin, young adult male in NAD, sitting up on ER stretcher.  RN at bedside HEENT: MM pink/dry, no jvd PSY: calm/appropriate Neuro: AAOx4, speech clear / speaking in full sentences  CV: s1s2 rrr, no m/r/g, ST PULM: even/non-labored, lungs bilaterally with diffuse coarse wheezing  ZO:XWRU, non-tender, bsx4 active  Extremities: warm/dry, no edema  Skin: no rashes or lesions.  Small abrasion on upper central back (pt reports he slid did a controlled slide down the stairs due to SOB)    Recent Labs Lab 03/14/17 0612  NA 137  K 3.8  CL 97*  CO2 29  BUN 12  CREATININE 0.74  GLUCOSE 139*     Recent Labs Lab 03/14/17 0612  HGB 14.5  HCT 42.5  WBC 17.1*  PLT 373    Dg Chest Portable 1 View  Result Date: 03/14/2017 CLINICAL DATA:  Sudden onset wheezing and shortness of breath EXAM: PORTABLE CHEST 1 VIEW COMPARISON:  01/17/2017 FINDINGS: Pulmonary hyperinflation. Perihilar opacities with peribronchial thickening suggesting reactive airways disease versus bronchiolitis. No airspace disease or consolidation in the lungs. No blunting of costophrenic angles. No pneumothorax. Mediastinal contours appear intact. Normal heart size and pulmonary vascularity. IMPRESSION: Hyperinflation and perihilar opacities suggesting reactive airways disease versus bronchiolitis. No focal consolidation. Electronically Signed   By: Burman Nieves M.D.   On: 03/14/2017 06:23     SIGNIFICANT EVENTS  10/16  Admit with SOB / Acute Asthma Exacerbation   STUDIES:  CXR 10/16 >> hyperinflation, peribronchial thickening, no  consolidation   ASSESSMENT / PLAN:  Discussion: 26 y/o M who presented 10/16 with dyspnea and wheezing.  Former smoker (quit in 12/2016).  Concern for acute asthma exacerbation.  Asthma has not been defined with PFT's.  Improved with BiPAP support.    Diffuse Bronchospasm - concern for underlying asthma / obstructive lung disease.   Pleuritic Chest Pain - no PTX noted on CXR, but pt at risk given anatomy / hyperinflation Tobacco / Marijuana Abuse  Plan: Cycle on / off BiPAP for next 24 hours.  Allow breaks if able.  Hopeful to avoid intubation  NPO while on BiPAP > ok for sips when off if no distress Brovana + Pulmicort BID  Atrovent / Albuterol Q6 for first 24 hours in addition to above  Solumedrol 125 mg IV q6 Follow intermittent CXR  Assess HIV, RVP, UDS  Will need outpatient pulmonary follow up with PFT assessment  Smoking cessation counseling    Canary Brim, NP-C [facility] Pgr: [phone] or if no answer [phone] 03/14/2017, 8:32 AM

## 2017-03-14 NOTE — Progress Notes (Addendum)
Patient caught getting on elevator. Patient was persuaded to sign AMA papers. However, the patient would not wait to talk with the Provider. Attending Provider and Charge Nurse.notified. Patient left with girlfriend who was visiting. Patient was counseled on leaving without medically been cleared. Patient verbalized understanding of consequences of leaving hospital against medical advise.

## 2017-03-14 NOTE — ED Triage Notes (Signed)
Patient is alert and oriented x4.  He is from home and being seen for respiratory distress.  Patient was given 3 neb treatments and 0.3mg  of epinephrine in route to the ED.  EMS states that patient is doing better.  Patient continues to have expiratory wheezing on arrival to the ED.  Patient is complaining of back pain.

## 2017-03-15 LAB — HIV ANTIBODY (ROUTINE TESTING W REFLEX): HIV Screen 4th Generation wRfx: NONREACTIVE

## 2017-03-17 LAB — CULTURE, RESPIRATORY W GRAM STAIN

## 2017-03-17 LAB — CULTURE, RESPIRATORY

## 2017-03-20 LAB — URINE DRUGS OF ABUSE SCREEN W ALC, ROUTINE (REF LAB)
BENZODIAZEPINE QUANT UR: NEGATIVE ng/mL
Barbiturate, Ur: NEGATIVE ng/mL
Cocaine (Metab.): NEGATIVE ng/mL
ETHANOL U, QUAN: NEGATIVE %
Methadone Screen, Urine: NEGATIVE ng/mL
Phencyclidine, Ur: NEGATIVE ng/mL
Propoxyphene, Urine: NEGATIVE ng/mL

## 2017-03-20 LAB — AMPHETAMINE CONF, UR
AMPHETAMINE: POSITIVE — AB
Amphetamine GC/MS Conf: 10208 ng/mL
Amphetamines: POSITIVE — AB
METHAMPHETAMINE QUANT UR: 83226 ng/mL
Methamphetamine, Ur: POSITIVE — AB

## 2017-03-20 LAB — OPIATES CONFIRMATION, URINE
CODEINE GC/MS CONF: 1400 ng/mL
CODEINE: POSITIVE — AB
MORPHINE: POSITIVE — AB
Morphine GC/MS Conf: >3000 ng/mL
OPIATES: POSITIVE — AB

## 2017-03-20 LAB — PANEL 799049
CANNABINOID GC/MS UR: POSITIVE — AB
CARBOXY THC GC/MS CONF: 38 ng/mL

## 2017-12-31 DIAGNOSIS — J9601 Acute respiratory failure with hypoxia: Secondary | ICD-10-CM

## 2018-01-01 DIAGNOSIS — R0602 Shortness of breath: Secondary | ICD-10-CM

## 2018-01-03 DIAGNOSIS — R748 Abnormal levels of other serum enzymes: Secondary | ICD-10-CM

## 2018-01-03 DIAGNOSIS — F1910 Other psychoactive substance abuse, uncomplicated: Secondary | ICD-10-CM

## 2018-01-03 DIAGNOSIS — A419 Sepsis, unspecified organism: Secondary | ICD-10-CM

## 2018-01-03 DIAGNOSIS — R0602 Shortness of breath: Secondary | ICD-10-CM

## 2018-01-03 DIAGNOSIS — J690 Pneumonitis due to inhalation of food and vomit: Secondary | ICD-10-CM

## 2018-01-03 DIAGNOSIS — G9341 Metabolic encephalopathy: Secondary | ICD-10-CM

## 2018-01-03 DIAGNOSIS — J189 Pneumonia, unspecified organism: Secondary | ICD-10-CM

## 2018-01-04 DIAGNOSIS — B377 Candidal sepsis: Secondary | ICD-10-CM

## 2018-07-20 IMAGING — DX DG CHEST 1V PORT
1 series · 2 of 2 positions shown · non-contrast
Comparison: 01/17/2017

CLINICAL DATA: Sudden onset wheezing and shortness of breath

EXAM:
PORTABLE CHEST 1 VIEW

[Series 1: chest ap · 0.14mm/px · 2 of 2 slices shown]
[im 1/2]
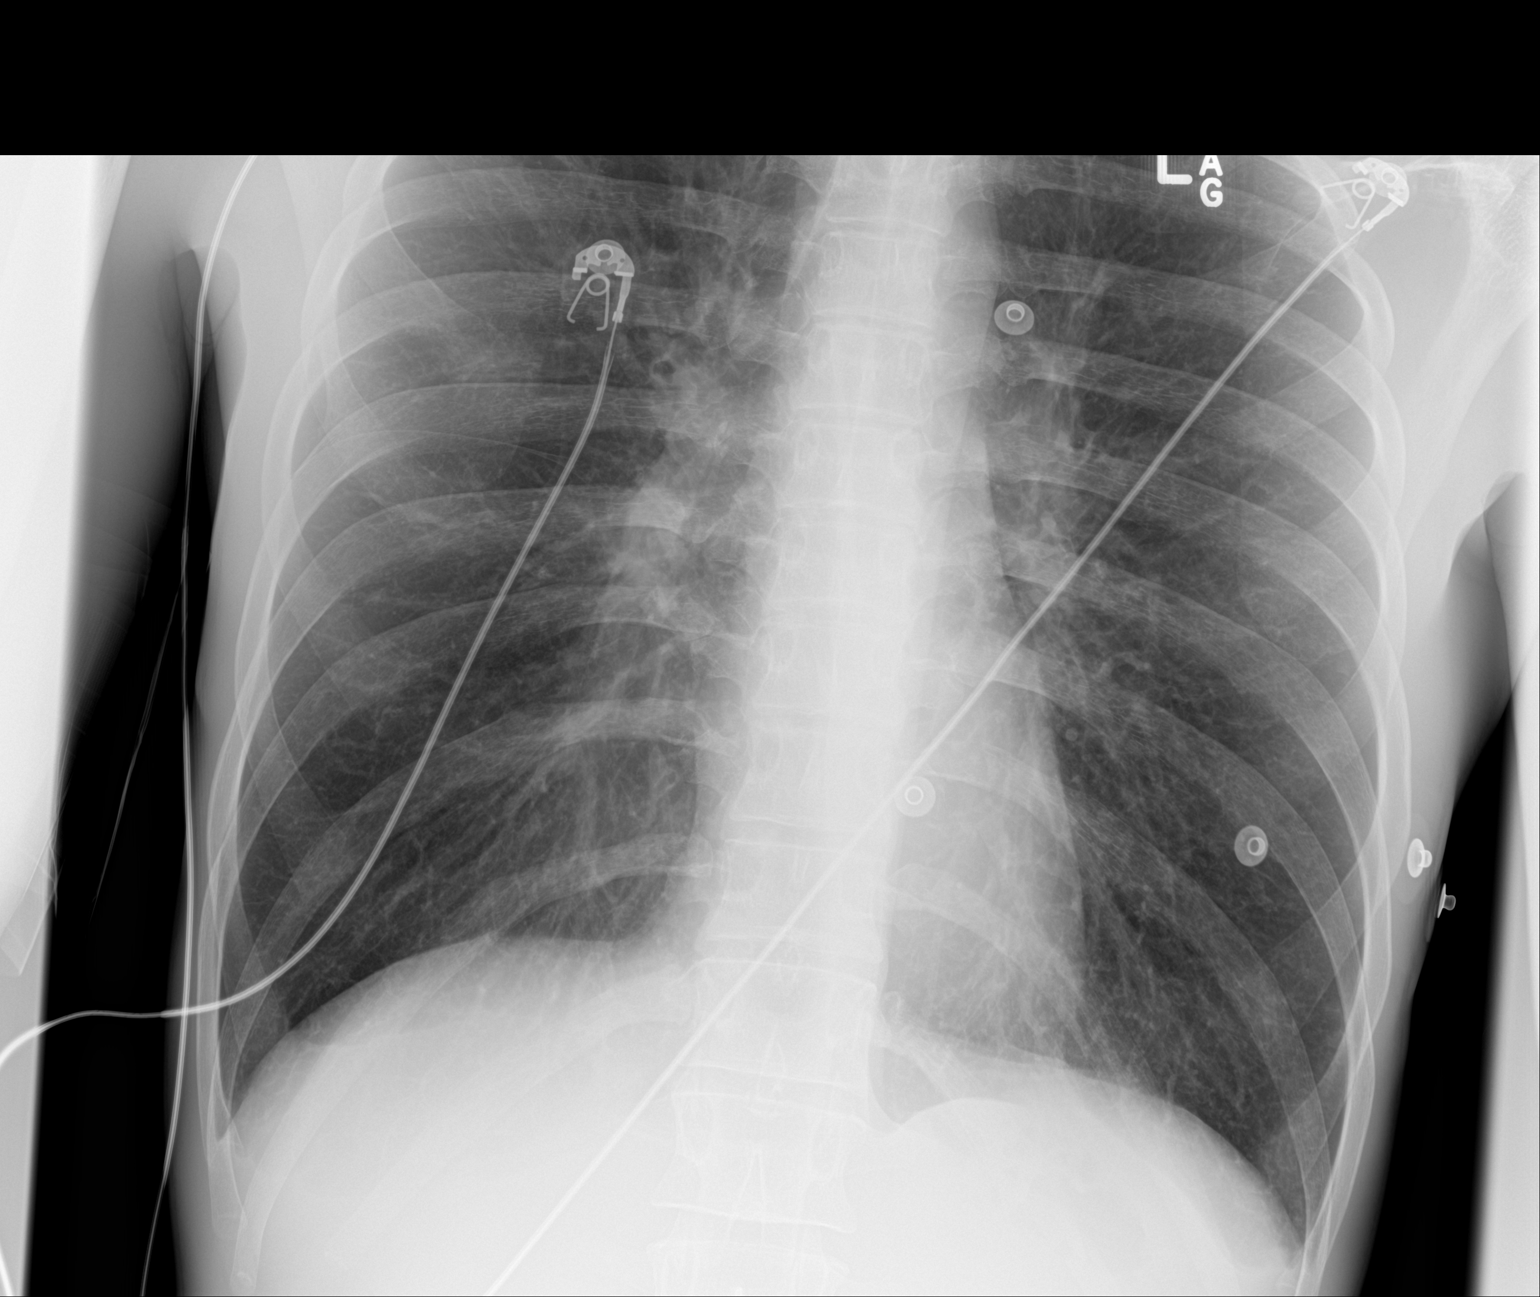
[im 2/2]
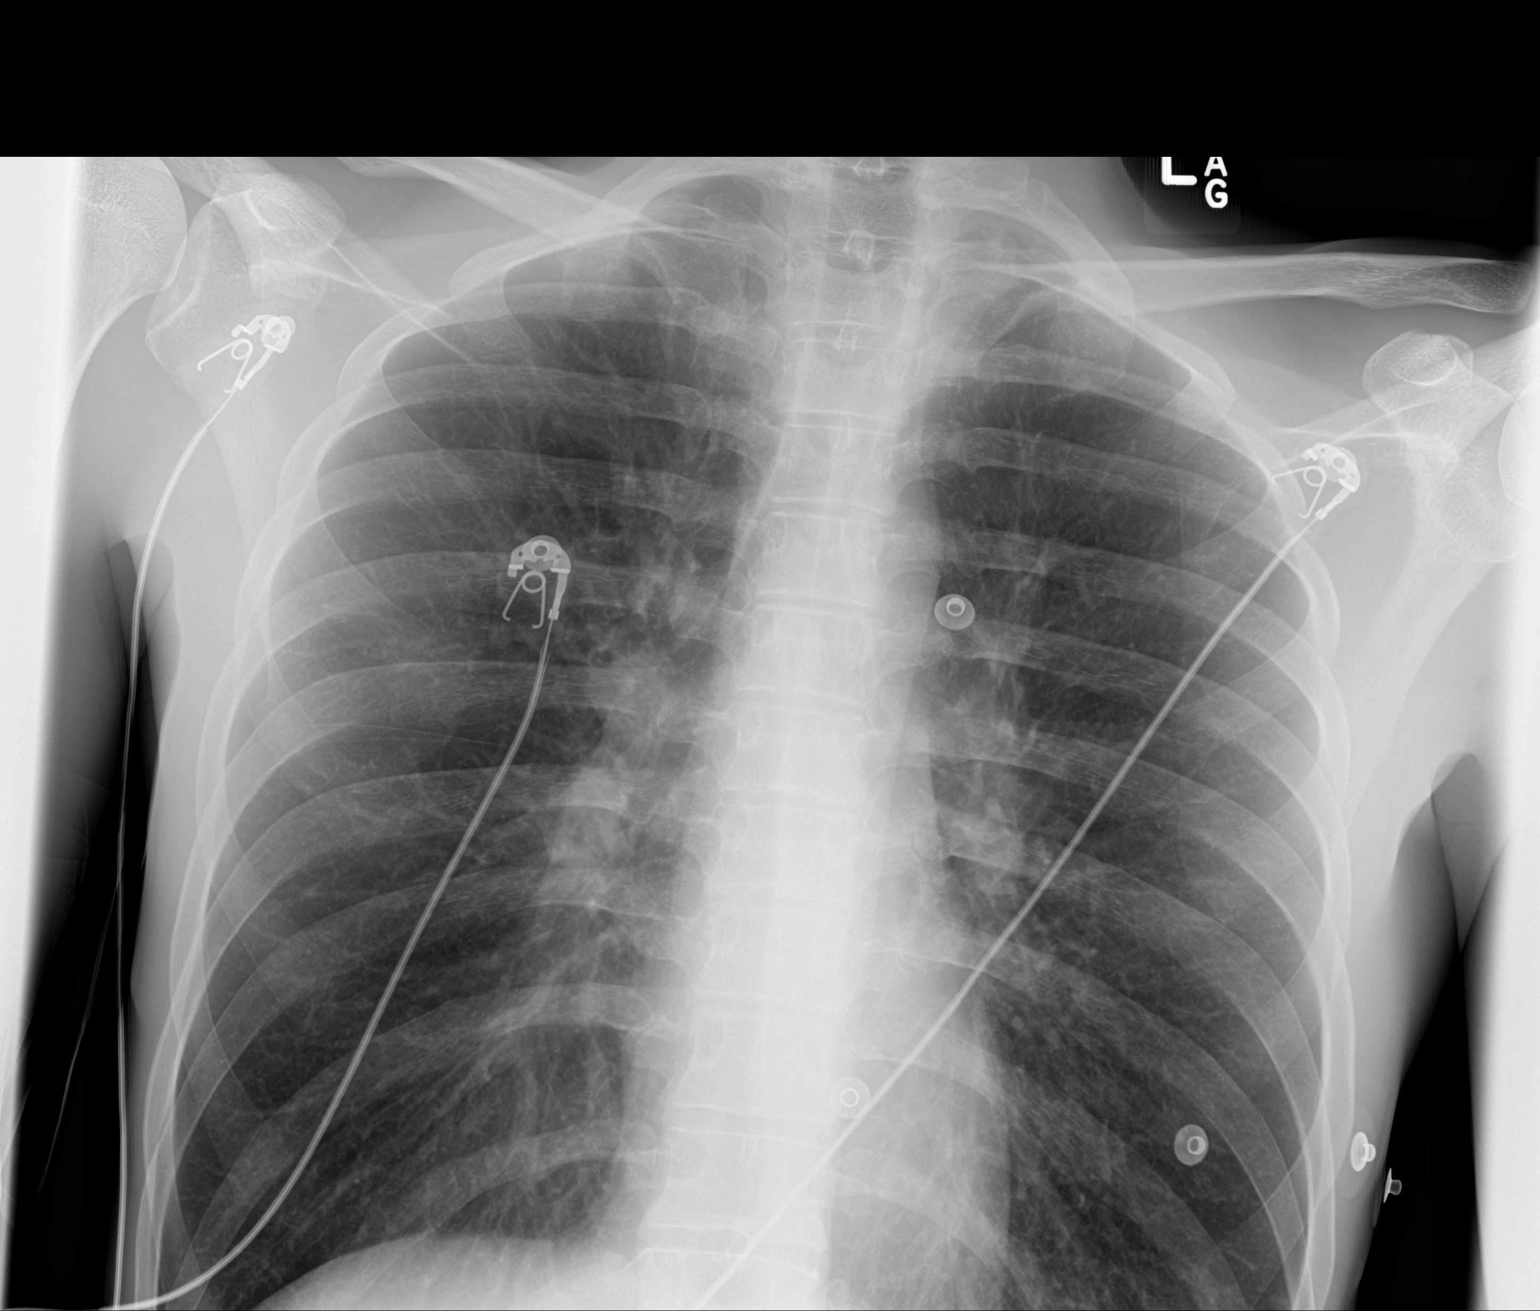

[2 of 2 positions shown; findings below may reference images not displayed]

FINDINGS: Pulmonary hyperinflation. Perihilar opacities with peribronchial
thickening suggesting reactive airways disease versus bronchiolitis.
No airspace disease or consolidation in the lungs. No blunting of
costophrenic angles. No pneumothorax. Mediastinal contours appear
intact. Normal heart size and pulmonary vascularity.
IMPRESSION: Hyperinflation and perihilar opacities suggesting reactive airways
disease versus bronchiolitis. No focal consolidation.
